# Patient Record
Sex: Male | Born: 1995 | Race: Black or African American | Hispanic: No | Marital: Married | Smoking: Never smoker
Health system: Southern US, Community
[De-identification: ages and names within clinical notes are randomized; demographics above are authoritative.]

## PROBLEM LIST (undated history)

## (undated) DIAGNOSIS — Z973 Presence of spectacles and contact lenses: Secondary | ICD-10-CM

## (undated) HISTORY — PX: CIRCUMCISION: SUR203

## (undated) HISTORY — PX: WISDOM TOOTH EXTRACTION: SHX21

---
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
## 2017-06-07 DIAGNOSIS — B070 Plantar wart: Secondary | ICD-10-CM | POA: Diagnosis not present

## 2017-06-14 DIAGNOSIS — M79672 Pain in left foot: Secondary | ICD-10-CM | POA: Diagnosis not present

## 2017-07-09 DIAGNOSIS — M79672 Pain in left foot: Secondary | ICD-10-CM | POA: Diagnosis not present

## 2017-07-18 DIAGNOSIS — B078 Other viral warts: Secondary | ICD-10-CM | POA: Diagnosis not present

## 2017-10-08 DIAGNOSIS — B070 Plantar wart: Secondary | ICD-10-CM | POA: Diagnosis not present

## 2017-12-29 DIAGNOSIS — R05 Cough: Secondary | ICD-10-CM | POA: Diagnosis not present

## 2018-10-05 ENCOUNTER — Other Ambulatory Visit: Payer: Self-pay

## 2018-10-05 DIAGNOSIS — Z20822 Contact with and (suspected) exposure to covid-19: Secondary | ICD-10-CM

## 2018-10-06 LAB — NOVEL CORONAVIRUS, NAA: SARS-CoV-2, NAA: NOT DETECTED

## 2020-12-30 ENCOUNTER — Other Ambulatory Visit: Payer: Self-pay

## 2020-12-30 ENCOUNTER — Ambulatory Visit
Admission: EM | Admit: 2020-12-30 | Discharge: 2020-12-30 | Disposition: A | Discharge status: Home / Self Care | Payer: 59 | Attending: Internal Medicine | Admitting: Internal Medicine

## 2020-12-30 ENCOUNTER — Encounter: Payer: Self-pay | Admitting: Emergency Medicine

## 2020-12-30 DIAGNOSIS — H65192 Other acute nonsuppurative otitis media, left ear: Secondary | ICD-10-CM | POA: Diagnosis not present

## 2020-12-30 DIAGNOSIS — R509 Fever, unspecified: Secondary | ICD-10-CM

## 2020-12-30 DIAGNOSIS — Z20828 Contact with and (suspected) exposure to other viral communicable diseases: Secondary | ICD-10-CM

## 2020-12-30 DIAGNOSIS — R110 Nausea: Secondary | ICD-10-CM

## 2020-12-30 DIAGNOSIS — R6889 Other general symptoms and signs: Secondary | ICD-10-CM

## 2020-12-30 DIAGNOSIS — Z20822 Contact with and (suspected) exposure to covid-19: Secondary | ICD-10-CM

## 2020-12-30 LAB — POCT INFLUENZA A/B
Influenza A, POC: NEGATIVE
Influenza B, POC: NEGATIVE

## 2020-12-30 MED ORDER — ONDANSETRON 4 MG PO TBDP: 4.0000 mg | ORAL_TABLET | Freq: Three times a day (TID) | ORAL | 0 refills | Status: DC | PRN

## 2020-12-30 MED ORDER — OSELTAMIVIR PHOSPHATE 75 MG PO CAPS: 75.0000 mg | ORAL_CAPSULE | Freq: Two times a day (BID) | ORAL | 0 refills | Status: DC

## 2020-12-30 MED ORDER — ACETAMINOPHEN 325 MG PO TABS
650.0000 mg | ORAL_TABLET | Freq: Once | ORAL | Status: AC
  Administered 2020-12-30: 650 mg via ORAL

## 2020-12-30 MED ORDER — AMOXICILLIN 875 MG PO TABS: 875.0000 mg | ORAL_TABLET | Freq: Two times a day (BID) | ORAL | 0 refills | Status: AC

## 2020-12-30 NOTE — ED Triage Notes (Signed)
Patient c/o headache, nausea, body chills that all started today.  Patient is vaccinated for COVID.

## 2020-12-30 NOTE — ED Provider Notes (Signed)
[facility]    CSN: [id number] Arrival date & time: 12/30/20  1503      History   Chief Complaint Chief Complaint  Patient presents with   Nausea    HPI James Lin is a 25 y.o. male.   Patient presents with headache, nausea without vomiting, chills that started today.  Patient denies any cough, nasal congestion, sore throat, ear pain, fevers at home, diarrhea, abdominal pain.  Patient reports that he was exposed to a new employee at his workplace and tested positive for the flu.  Denies any chest pain or shortness of breath.    Past Medical History:  Diagnosis Date   Wears contact lenses     There are no problems to display for this patient.   Past Surgical History:  Procedure Laterality Date   CIRCUMCISION     TONSILLECTOMY Bilateral 02/03/2016   Procedure: TONSILLECTOMY;  Surgeon: Vernie Murders, MD;  Location: [facility];  Service: ENT;  Laterality: Bilateral;   WISDOM TOOTH EXTRACTION         Home Medications    Prior to Admission medications   Medication Sig Start Date End Date Taking? Authorizing Provider  amoxicillin (AMOXIL) 875 MG tablet Take 1 tablet (875 mg total) by mouth 2 (two) times daily for 7 days. 12/30/20 01/06/21 Yes Jensen Cheramie, Rolly Salter E, FNP  ondansetron (ZOFRAN-ODT) 4 MG disintegrating tablet Take 1 tablet (4 mg total) by mouth every 8 (eight) hours as needed for nausea or vomiting. 12/30/20  Yes Ott Zimmerle, Acie Fredrickson, FNP  oseltamivir (TAMIFLU) 75 MG capsule Take 1 capsule (75 mg total) by mouth every 12 (twelve) hours. 12/30/20  Yes Ronnell Makarewicz, Acie Fredrickson, FNP    Family History Family History  Problem Relation Age of Onset   Pulmonary embolism Maternal Grandmother 73   Other Paternal Musician Accident    Social History Social History   Tobacco Use   Smoking status: Never   Smokeless tobacco: Never  Substance Use Topics   Alcohol use: No    Alcohol/week: 0.0 standard drinks   Drug use: No      Allergies   Patient has no known allergies.   Review of Systems Review of Systems Per HPI  Physical Exam Triage Vital Signs ED Triage Vitals  Enc Vitals Group     BP 12/30/20 1608 111/79     Pulse Rate 12/30/20 1608 (!) 107     Resp 12/30/20 1608 20     Temp 12/30/20 1608 (!) 102.6 F (39.2 C)     Temp Source 12/30/20 1608 Oral     SpO2 12/30/20 1608 98 %     Weight 12/30/20 1615 175 lb (79.4 kg)     Height 12/30/20 1615 5\' 8"  (1.727 m)     Head Circumference --      Peak Flow --      Pain Score 12/30/20 1615 0     Pain Loc --      Pain Edu? --      Excl. in GC? --    No data found.  Updated Vital Signs BP 111/79 (BP Location: Left Arm)   Pulse (!) 107   Temp (!) 102.6 F (39.2 C) (Oral)   Resp 20   Ht 5\' 8"  (1.727 m)   Wt 175 lb (79.4 kg)   SpO2 98%   BMI 26.61 kg/m   Visual Acuity Right Eye Distance:   Left Eye Distance:   Bilateral  Distance:    Right Eye Near:   Left Eye Near:    Bilateral Near:     Physical Exam Constitutional:      General: He is not in acute distress.    Appearance: Normal appearance. He is not toxic-appearing or diaphoretic.  HENT:     Head: Normocephalic and atraumatic.     Right Ear: Tympanic membrane and ear canal normal.     Left Ear: Ear canal normal. Tympanic membrane is erythematous. Tympanic membrane is not scarred or bulging.     Nose: Congestion present.     Mouth/Throat:     Mouth: Mucous membranes are moist.     Pharynx: No posterior oropharyngeal erythema.  Eyes:     Extraocular Movements: Extraocular movements intact.     Conjunctiva/sclera: Conjunctivae normal.     Pupils: Pupils are equal, round, and reactive to light.  Cardiovascular:     Rate and Rhythm: Normal rate and regular rhythm.     Pulses: Normal pulses.     Heart sounds: Normal heart sounds.  Pulmonary:     Effort: Pulmonary effort is normal. No respiratory distress.     Breath sounds: Normal breath sounds. No wheezing.  Abdominal:      General: Abdomen is flat. Bowel sounds are normal. There is no distension.     Palpations: Abdomen is soft.     Tenderness: There is no abdominal tenderness.  Musculoskeletal:        General: Normal range of motion.     Cervical back: Normal range of motion.  Skin:    General: Skin is warm and dry.  Neurological:     General: No focal deficit present.     Mental Status: He is alert and oriented to person, place, and time. Mental status is at baseline.  Psychiatric:        Mood and Affect: Mood normal.        Behavior: Behavior normal.        Thought Content: Thought content normal.        Judgment: Judgment normal.     UC Treatments / Results  Labs (all labs ordered are listed, but only abnormal results are displayed) Labs Reviewed  COVID-19, FLU A+B NAA  POCT INFLUENZA A/B    EKG   Radiology No results found.  Procedures Procedures (including critical care time)  Medications Ordered in UC Medications  acetaminophen (TYLENOL) tablet 650 mg (650 mg Oral Given 12/30/20 1614)    Initial Impression / Assessment and Plan / UC Course  I have reviewed the triage vital signs and the nursing notes.  Pertinent labs & imaging results that were available during my care of the patient were reviewed by me and considered in my medical decision making (see chart for details).     Rapid flu test was negative but still suspicious of influenza given patient's symptoms and close exposure.  Will treat with Tamiflu x5 days.  Patient also has left otitis media that is being treated with amoxicillin antibiotic.  Ondansetron to take as needed for nausea.  Fever monitoring and management discussed with patient.  Acetaminophen administered in urgent care today.  Discussed strict return precautions.  Patient verbalized understanding and was agreeable with plan. Final Clinical Impressions(s) / UC Diagnoses   Final diagnoses:  Flu-like symptoms  Fever, unspecified  Exposure to the flu   Other non-recurrent acute nonsuppurative otitis media of left ear  Encounter for laboratory testing for COVID-19 virus  Nausea without vomiting  Discharge Instructions      Your rapid flu test was negative but still suspicious of the flu given your symptoms and your close exposure.  You are being treated with Tamiflu.  You also have a left ear infection which is being treated with amoxicillin antibiotic.  Nausea medication has been sent over as well to take as needed.  Please continue to monitor fevers and treat as appropriate with Tylenol or ibuprofen.  COVID-19 test is pending.  We will call if it is positive.     ED Prescriptions     Medication Sig Dispense Auth. Provider   oseltamivir (TAMIFLU) 75 MG capsule Take 1 capsule (75 mg total) by mouth every 12 (twelve) hours. 10 capsule Ervin Knack E, [place]   amoxicillin (AMOXIL) 875 MG tablet Take 1 tablet (875 mg total) by mouth 2 (two) times daily for 7 days. 14 tablet [place], [place] E, [place]   ondansetron (ZOFRAN-ODT) 4 MG disintegrating tablet Take 1 tablet (4 mg total) by mouth every 8 (eight) hours as needed for nausea or vomiting. 20 tablet [place], Acie Fredrickson, [place]      PDMP not reviewed this encounter.   Gustavus Bryant, [place] 12/30/20 [phone]

## 2020-12-30 NOTE — Discharge Instructions (Addendum)
Your rapid flu test was negative but still suspicious of the flu given your symptoms and your close exposure.  You are being treated with Tamiflu.  You also have a left ear infection which is being treated with amoxicillin antibiotic.  Nausea medication has been sent over as well to take as needed.  Please continue to monitor fevers and treat as appropriate with Tylenol or ibuprofen.  COVID-19 test is pending.  We will call if it is positive.

## 2020-12-31 LAB — COVID-19, FLU A+B NAA
Influenza A, NAA: NOT DETECTED
Influenza B, NAA: NOT DETECTED
SARS-CoV-2, NAA: NOT DETECTED

## 2023-03-04 ENCOUNTER — Emergency Department (HOSPITAL_BASED_OUTPATIENT_CLINIC_OR_DEPARTMENT_OTHER)
Admission: EM | Admit: 2023-03-04 | Discharge: 2023-03-04 | Disposition: A | Discharge status: Home / Self Care | Payer: 59 | Attending: Student | Admitting: Student

## 2023-03-04 ENCOUNTER — Other Ambulatory Visit: Payer: Self-pay

## 2023-03-04 ENCOUNTER — Encounter (HOSPITAL_BASED_OUTPATIENT_CLINIC_OR_DEPARTMENT_OTHER): Payer: Self-pay | Admitting: Emergency Medicine

## 2023-03-04 ENCOUNTER — Emergency Department (HOSPITAL_BASED_OUTPATIENT_CLINIC_OR_DEPARTMENT_OTHER): Payer: 59

## 2023-03-04 DIAGNOSIS — K852 Alcohol induced acute pancreatitis without necrosis or infection: Secondary | ICD-10-CM | POA: Insufficient documentation

## 2023-03-04 DIAGNOSIS — Z1152 Encounter for screening for COVID-19: Secondary | ICD-10-CM | POA: Diagnosis not present

## 2023-03-04 DIAGNOSIS — D72829 Elevated white blood cell count, unspecified: Secondary | ICD-10-CM | POA: Diagnosis not present

## 2023-03-04 DIAGNOSIS — R1013 Epigastric pain: Secondary | ICD-10-CM | POA: Diagnosis present

## 2023-03-04 LAB — URINALYSIS, ROUTINE W REFLEX MICROSCOPIC
Bilirubin Urine: NEGATIVE
Glucose, UA: NEGATIVE mg/dL
Ketones, ur: NEGATIVE mg/dL
Leukocytes,Ua: NEGATIVE
Nitrite: NEGATIVE
Protein, ur: NEGATIVE mg/dL
Specific Gravity, Urine: 1.01 (ref 1.005–1.030)
pH: 6 (ref 5.0–8.0)

## 2023-03-04 LAB — CBC WITH DIFFERENTIAL/PLATELET
Abs Immature Granulocytes: 0.04 10*3/uL (ref 0.00–0.07)
Basophils Absolute: 0 10*3/uL (ref 0.0–0.1)
Basophils Relative: 0 %
Eosinophils Absolute: 0 10*3/uL (ref 0.0–0.5)
Eosinophils Relative: 0 %
HCT: 44.2 % (ref 39.0–52.0)
Hemoglobin: 14.8 g/dL (ref 13.0–17.0)
Immature Granulocytes: 0 %
Lymphocytes Relative: 12 %
Lymphs Abs: 1.5 10*3/uL (ref 0.7–4.0)
MCH: 27.2 pg (ref 26.0–34.0)
MCHC: 33.5 g/dL (ref 30.0–36.0)
MCV: 81.1 fL (ref 80.0–100.0)
Monocytes Absolute: 1.4 10*3/uL — ABNORMAL HIGH (ref 0.1–1.0)
Monocytes Relative: 11 %
Neutro Abs: 9.9 10*3/uL — ABNORMAL HIGH (ref 1.7–7.7)
Neutrophils Relative %: 77 %
Platelets: 222 10*3/uL (ref 150–400)
RBC: 5.45 MIL/uL (ref 4.22–5.81)
RDW: 14.5 % (ref 11.5–15.5)
WBC: 12.8 10*3/uL — ABNORMAL HIGH (ref 4.0–10.5)
nRBC: 0 % (ref 0.0–0.2)

## 2023-03-04 LAB — RESP PANEL BY RT-PCR (RSV, FLU A&B, COVID)  RVPGX2
Influenza A by PCR: NEGATIVE
Influenza B by PCR: NEGATIVE
Resp Syncytial Virus by PCR: NEGATIVE
SARS Coronavirus 2 by RT PCR: NEGATIVE

## 2023-03-04 LAB — COMPREHENSIVE METABOLIC PANEL
ALT: 37 U/L (ref 0–44)
AST: 23 U/L (ref 15–41)
Albumin: 4.6 g/dL (ref 3.5–5.0)
Alkaline Phosphatase: 39 U/L (ref 38–126)
Anion gap: 11 (ref 5–15)
BUN: 11 mg/dL (ref 6–20)
CO2: 27 mmol/L (ref 22–32)
Calcium: 9.4 mg/dL (ref 8.9–10.3)
Chloride: 97 mmol/L — ABNORMAL LOW (ref 98–111)
Creatinine, Ser: 0.98 mg/dL (ref 0.61–1.24)
GFR, Estimated: >60 mL/min (ref >60)
Glucose, Bld: 103 mg/dL — ABNORMAL HIGH (ref 70–99)
Potassium: 3.5 mmol/L (ref 3.5–5.1)
Sodium: 135 mmol/L (ref 135–145)
Total Bilirubin: 1.1 mg/dL (ref 0.0–1.2)
Total Protein: 7.9 g/dL (ref 6.5–8.1)

## 2023-03-04 LAB — LIPASE, BLOOD: Lipase: 58 U/L — ABNORMAL HIGH (ref 11–51)

## 2023-03-04 LAB — URINALYSIS, MICROSCOPIC (REFLEX)

## 2023-03-04 MED ORDER — ALUM & MAG HYDROXIDE-SIMETH 200-200-20 MG/5ML PO SUSP
15.0000 mL | Freq: Once | ORAL | Status: AC
  Administered 2023-03-04: 15 mL via ORAL
  Filled 2023-03-04: qty 30

## 2023-03-04 MED ORDER — FAMOTIDINE 20 MG PO TABS
20.0000 mg | ORAL_TABLET | Freq: Once | ORAL | Status: AC
  Administered 2023-03-04: 20 mg via ORAL
  Filled 2023-03-04: qty 1

## 2023-03-04 MED ORDER — OXYCODONE HCL 5 MG PO TABS: 2.5000 mg | ORAL_TABLET | Freq: Four times a day (QID) | ORAL | 0 refills | Status: AC | PRN

## 2023-03-04 MED ORDER — ONDANSETRON 4 MG PO TBDP
8.0000 mg | ORAL_TABLET | Freq: Once | ORAL | Status: AC
  Administered 2023-03-04: 8 mg via ORAL
  Filled 2023-03-04: qty 2

## 2023-03-04 MED ORDER — IOHEXOL 300 MG/ML  SOLN
75.0000 mL | Freq: Once | INTRAMUSCULAR | Status: AC | PRN
  Administered 2023-03-04: 75 mL via INTRAVENOUS

## 2023-03-04 MED ORDER — DICYCLOMINE HCL 10 MG PO CAPS
10.0000 mg | ORAL_CAPSULE | Freq: Once | ORAL | Status: AC
  Administered 2023-03-04: 10 mg via ORAL
  Filled 2023-03-04: qty 1

## 2023-03-04 MED ORDER — ONDANSETRON 4 MG PO TBDP: 4.0000 mg | ORAL_TABLET | Freq: Three times a day (TID) | ORAL | 0 refills | Status: AC | PRN

## 2023-03-04 MED ORDER — IBUPROFEN 600 MG PO TABS: 600.0000 mg | ORAL_TABLET | Freq: Four times a day (QID) | ORAL | 0 refills | Status: AC | PRN

## 2023-03-04 NOTE — Discharge Instructions (Addendum)
 As discussed, CT imaging concerning for pancreatitis.  This is most likely from alcohol consumption from the wedding.  Will send you home with ibuprofen  to take for baseline pain with pain medication for breakthrough pain.  Will also send home with Zofran  to take as needed for any nausea and vomiting.  See information attached to discharge paperwork regarding dietary changes to make while your pancreas is healing itself.  Please do not drink any alcohol while pancreas is healing.  Please do not hesitate to return to emergency department if the worrisome signs and symptoms we discussed become apparent.

## 2023-03-04 NOTE — ED Triage Notes (Signed)
 Pt c/o NV,  epigastric abd pain since yesterday, felt bloated; pain is worse now

## 2023-03-04 NOTE — ED Provider Notes (Signed)
 [place] [facility] Provider Note   CSN: [id number] Arrival date & time: 03/04/23  1324     History  Chief Complaint  Patient presents with   Abdominal Pain    James Lin is a 28 y.o. male.   Abdominal Pain   28 year old male presents emergency department with complaints of pain in upper middle abdomen.  Reports pain beginning yesterday.  Reports nausea with a couple episodes of emesis since symptom onset.  Reports recently traveling from [place] .  Reports no suspicious food consumption as part of bedside has eaten the same things and has not been symptomatic.  Reports pain worsened with consumption of food cough.  Patient also reports feeling as if he is full.  Reports last bowel movement yesterday and regular per patient.  Denies any history of abdominal surgeries.  Denies any fever, chills, urinary symptoms, change in bowel habits.  Denies any hematemesis.  Has tried no medication for his symptoms.  No significant pertinent past medical history.  Home Medications Prior to Admission medications   Medication Sig Start Date End Date Taking? Authorizing Provider  ibuprofen  (ADVIL ) 600 MG tablet Take 1 tablet (600 mg total) by mouth every 6 (six) hours as needed. 03/04/23  Yes Silver Fell A, PA  ondansetron  (ZOFRAN -ODT) 4 MG disintegrating tablet Take 1 tablet (4 mg total) by mouth every 8 (eight) hours as needed. 03/04/23  Yes Silver Fell A, PA  oxyCODONE  (ROXICODONE ) 5 MG immediate release tablet Take 0.5 tablets (2.5 mg total) by mouth every 6 (six) hours as needed for severe pain (pain score 7-10). 03/04/23  Yes Silver Fell LABOR, PA      Allergies    Patient has no known allergies.    Review of Systems   Review of Systems  Gastrointestinal:  Positive for abdominal pain.  All other systems reviewed and are negative.   Physical Exam Updated Vital Signs BP (!) 137/96 (BP Location: Right Arm)   Pulse 90   Temp 98.1 F  (36.7 C)   Resp 18   Ht 5' 9 (1.753 m)   Wt 83.9 kg   SpO2 99%   BMI 27.32 kg/m  Physical Exam Vitals and nursing note reviewed.  Constitutional:      General: He is not in acute distress.    Appearance: He is well-developed.  HENT:     Head: Normocephalic and atraumatic.  Eyes:     Conjunctiva/sclera: Conjunctivae normal.  Cardiovascular:     Rate and Rhythm: Normal rate and regular rhythm.     Heart sounds: No murmur heard. Pulmonary:     Effort: Pulmonary effort is normal. No respiratory distress.     Breath sounds: Normal breath sounds.  Abdominal:     Palpations: Abdomen is soft.     Tenderness: There is abdominal tenderness in the epigastric area. There is no right CVA tenderness, left CVA tenderness or guarding. Negative signs include Murphy's sign and McBurney's sign.  Musculoskeletal:        General: No swelling.     Cervical back: Neck supple.  Skin:    General: Skin is warm and dry.     Capillary Refill: Capillary refill takes less than 2 seconds.  Neurological:     Mental Status: He is alert.  Psychiatric:        Mood and Affect: Mood normal.     ED Results / Procedures / Treatments   Labs (all labs ordered are listed, but only  abnormal results are displayed) Labs Reviewed  LIPASE, BLOOD - Abnormal; Notable for the following components:      Result Value   Lipase 58 (*)    All other components within normal limits  URINALYSIS, ROUTINE W REFLEX MICROSCOPIC - Abnormal; Notable for the following components:   Hgb urine dipstick TRACE (*)    All other components within normal limits  COMPREHENSIVE METABOLIC PANEL - Abnormal; Notable for the following components:   Chloride 97 (*)    Glucose, Bld 103 (*)    All other components within normal limits  CBC WITH DIFFERENTIAL/PLATELET - Abnormal; Notable for the following components:   WBC 12.8 (*)    Neutro Abs 9.9 (*)    Monocytes Absolute 1.4 (*)    All other components within normal limits   URINALYSIS, MICROSCOPIC (REFLEX) - Abnormal; Notable for the following components:   Bacteria, UA RARE (*)    All other components within normal limits  RESP PANEL BY RT-PCR (RSV, FLU A&B, COVID)  [id number]    EKG None  Radiology CT ABDOMEN PELVIS W CONTRAST Result Date: 03/04/2023 CLINICAL DATA:  Abdominal pain, acute, nonlocalized EXAM: CT ABDOMEN AND PELVIS WITH CONTRAST TECHNIQUE: Multidetector CT imaging of the abdomen and pelvis was performed using the standard protocol following bolus administration of intravenous contrast. RADIATION DOSE REDUCTION: This exam was performed according to the departmental dose-optimization program which includes automated exposure control, adjustment of the mA and/or kV according to patient size and/or use of iterative reconstruction technique. CONTRAST:  75mL OMNIPAQUE  IOHEXOL  300 MG/ML  SOLN COMPARISON:  None Available. FINDINGS: Lower chest: No acute abnormality. Hepatobiliary: No focal liver abnormality is seen. No gallstones, gallbladder wall thickening, or biliary dilatation. Pancreas: Pancreatic tail is mildly enlarged with subtle peripancreatic fat stranding. No fluid collections or ductal dilatation. Spleen: Normal in size without focal abnormality. Adrenals/Urinary Tract: Unremarkable adrenal glands. Kidneys enhance symmetrically without focal lesion, stone, or hydronephrosis. Ureters are nondilated. Urinary bladder appears unremarkable for the degree of distention. Stomach/Bowel: Stomach is within normal limits. Appendix appears normal (series 301, image 59). No evidence of bowel wall thickening, distention, or inflammatory changes. Vascular/Lymphatic: No significant vascular findings are present. No enlarged abdominal or pelvic lymph nodes. Reproductive: Prostate is unremarkable. Other: No free fluid. No abdominopelvic fluid collection. No pneumoperitoneum. No abdominal wall hernia. Musculoskeletal: No acute or significant osseous findings. IMPRESSION:  Findings suggestive of acute uncomplicated pancreatitis involving the pancreatic tail. Electronically Signed   By: Mabel Converse D.O.   On: 03/04/2023 17:29    Procedures Procedures    Medications Ordered in ED Medications  ondansetron  (ZOFRAN -ODT) disintegrating tablet 8 mg (8 mg Oral Given 03/04/23 1546)  alum & mag hydroxide-simeth (MAALOX/MYLANTA) 200-200-20 MG/5ML suspension 15 mL (15 mLs Oral Given 03/04/23 1557)  famotidine  (PEPCID ) tablet 20 mg (20 mg Oral Given 03/04/23 1557)  dicyclomine  (BENTYL ) capsule 10 mg (10 mg Oral Given 03/04/23 1557)  iohexol  (OMNIPAQUE ) 300 MG/ML solution 75 mL (75 mLs Intravenous Contrast Given 03/04/23 1709)    ED Course/ Medical Decision Making/ A&P Clinical Course as of 03/04/23 1749  Sun Mar 04, 2023  1612 Shared decision made conversation was had with patient regarding trial of p.o. with GI cocktail and holding on imaging studies given the patient's main pain in epigastric region with reassuring labs.  Will trial p.o. and reassess. [CR]  1642 After GI cocktail, patient noted worsening of symptoms.  Will obtain CT scan for assessment of intra-abdominal abnormality. [CR]    Clinical Course User  Index [CR] Silver Wonda LABOR, PA                                 Medical Decision Making Amount and/or Complexity of Data Reviewed Labs: ordered. Radiology: ordered.  Risk OTC drugs. Prescription drug management.   This patient presents to the ED for concern of epigastric abdominal pain, this involves an extensive number of treatment options, and is a complaint that carries with it a high risk of complications and morbidity.  The differential diagnosis includes gastritis,, PUD, cholecystitis, CBD pathology, pancreatitis, especially LBO, volvulus, diverticulitis, appendicitis   Co morbidities that complicate the patient evaluation  See HPI   Additional history obtained:  Additional history obtained from EMR External records from outside  source obtained and reviewed including hospital records   Lab Tests:  I Ordered, and personally interpreted labs.  The pertinent results include: Leukocytosis of 12.8.  No evidence of anemia.  Placed within range.  Mild hypochloremia of 97 otherwise, chest within normal limits.  No transaminitis.  No renal dysfunction.  Lipase elevated to 58.  Viral panel negative.  UA with 3 bacteria but otherwise unremarkable.   Imaging Studies ordered:  I ordered imaging studies including CT abdomen pelvis I independently visualized and interpreted imaging which showed pancreatitis of tail of pancreas I agree with the radiologist interpretation   Cardiac Monitoring: / EKG:  The patient was maintained on a cardiac monitor.  I personally viewed and interpreted the cardiac monitored which showed an underlying rhythm of: Sinus rhythm   Consultations Obtained:  N/a   Problem List / ED Course / Critical interventions / Medication management  Epigastric abdominal pain, pancreatitis I ordered medication including Zofran , Pepcid , Bentyl , Maalox  Reevaluation of the patient after these medicines showed that the patient improved I have reviewed the patients home medicines and have made adjustments as needed   Social Determinants of Health:  Denies tobacco, illicit drug use   Test / Admission - Considered:  Epigastric abdominal pain, hepatitis Vitals signs within normal range and stable throughout visit. Laboratory/imaging studies significant for: See above 28 year old male presents emergency department with complaints of right upper middle abdominal pain, nausea and yesterday.  Per patient, at a wedding and had 3 alcoholic beverages prior to symptom onset.  On exam, tenderness epigastric region.  Labs concerning for slight leukocytosis to 12.8, mild elevation of lipase at 58.  Patient initially trialed p.o. given reassuring labs in the setting of epigastric abdominal pain with worsening of symptoms  after trial performed.  CT imaging was subsequently obtained which was concerning for pancreatitis tail of pancreas which fits with patient's symptoms.  Patient's pain controlled with subsequent tolerance of p.o.; will recommend dietary changes as described in AVS, avoidance of alcohol and follow-up with primary care.  Treatment plan discussed at length with patient and he acknowledged understanding was agreeable to said plan.  Patient overall well-appearing, afebrile in no acute distress. Worrisome signs and symptoms were discussed with the patient, and the patient acknowledged understanding to return to the ED if noticed. Patient was stable upon discharge.          Final Clinical Impression(s) / ED Diagnoses Final diagnoses:  Alcohol-induced acute pancreatitis without infection or necrosis    Rx / DC Orders ED Discharge Orders          Ordered    ibuprofen  (ADVIL ) 600 MG tablet  Every 6 hours PRN  03/04/23 1742    oxyCODONE  (ROXICODONE ) 5 MG immediate release tablet  Every 6 hours PRN        03/04/23 1742    ondansetron  (ZOFRAN -ODT) 4 MG disintegrating tablet  Every 8 hours PRN        03/04/23 1742              Silver Wonda LABOR, [place] 03/04/23 1749    Albertina Dixon, MD 03/05/23 1327
